# Patient Record
Sex: Male | Born: 1985 | Race: White | Hispanic: No | Marital: Single | Smoking: Former smoker
Health system: Southern US, Community
[De-identification: ages and names within clinical notes are randomized; demographics above are authoritative.]

## PROBLEM LIST (undated history)

## (undated) ENCOUNTER — Emergency Department (HOSPITAL_COMMUNITY): Discharge status: Absent without leave | Payer: Self-pay

## (undated) DIAGNOSIS — F329 Major depressive disorder, single episode, unspecified: Secondary | ICD-10-CM

## (undated) DIAGNOSIS — K859 Acute pancreatitis without necrosis or infection, unspecified: Secondary | ICD-10-CM

## (undated) DIAGNOSIS — F32A Depression, unspecified: Secondary | ICD-10-CM

## (undated) DIAGNOSIS — F1020 Alcohol dependence, uncomplicated: Secondary | ICD-10-CM

## (undated) DIAGNOSIS — I499 Cardiac arrhythmia, unspecified: Secondary | ICD-10-CM

---

## 2007-12-13 ENCOUNTER — Ambulatory Visit (HOSPITAL_COMMUNITY): Admission: RE | Admit: 2007-12-13 | Discharge: 2007-12-13 | Payer: Self-pay | Admitting: Family Medicine

## 2008-11-01 ENCOUNTER — Ambulatory Visit (HOSPITAL_COMMUNITY): Admission: RE | Admit: 2008-11-01 | Discharge: 2008-11-01 | Payer: Self-pay | Admitting: Family Medicine

## 2010-02-25 ENCOUNTER — Ambulatory Visit: Payer: Self-pay | Admitting: Psychiatry

## 2010-02-25 ENCOUNTER — Emergency Department (HOSPITAL_COMMUNITY)
Admission: EM | Admit: 2010-02-25 | Discharge: 2010-02-25 | Payer: Self-pay | Source: Home / Self Care | Admitting: Emergency Medicine

## 2010-02-25 ENCOUNTER — Inpatient Hospital Stay (HOSPITAL_COMMUNITY)
Admission: AD | Admit: 2010-02-25 | Discharge: 2010-03-01 | Payer: Self-pay | Source: Home / Self Care | Admitting: Psychiatry

## 2010-04-26 ENCOUNTER — Encounter: Payer: Self-pay | Admitting: Internal Medicine

## 2010-06-16 LAB — URINALYSIS, ROUTINE W REFLEX MICROSCOPIC
Bilirubin Urine: NEGATIVE
Glucose, UA: NEGATIVE mg/dL
Hgb urine dipstick: NEGATIVE
Ketones, ur: NEGATIVE mg/dL
Nitrite: NEGATIVE
Protein, ur: NEGATIVE mg/dL
Specific Gravity, Urine: 1.011 (ref 1.005–1.030)
Urobilinogen, UA: 0.2 mg/dL (ref 0.0–1.0)
pH: 5.5 (ref 5.0–8.0)

## 2010-06-16 LAB — ETHANOL: Alcohol, Ethyl (B): <5 mg/dL (ref 0–10)

## 2010-06-16 LAB — CBC
HCT: 42.9 % (ref 39.0–52.0)
Hemoglobin: 14.9 g/dL (ref 13.0–17.0)
MCH: 33.3 pg (ref 26.0–34.0)
MCHC: 34.7 g/dL (ref 30.0–36.0)
MCV: 96 fL (ref 78.0–100.0)
Platelets: 218 10*3/uL (ref 150–400)
RBC: 4.47 MIL/uL (ref 4.22–5.81)
RDW: 13.1 % (ref 11.5–15.5)
WBC: 7.5 10*3/uL (ref 4.0–10.5)

## 2010-06-16 LAB — DIFFERENTIAL
Basophils Absolute: 0 10*3/uL (ref 0.0–0.1)
Basophils Relative: 0 % (ref 0–1)
Eosinophils Absolute: 0.1 10*3/uL (ref 0.0–0.7)
Eosinophils Relative: 2 % (ref 0–5)
Lymphocytes Relative: 53 % — ABNORMAL HIGH (ref 12–46)
Lymphs Abs: 4 10*3/uL (ref 0.7–4.0)
Monocytes Absolute: 1 10*3/uL (ref 0.1–1.0)
Monocytes Relative: 13 % — ABNORMAL HIGH (ref 3–12)
Neutro Abs: 2.4 10*3/uL (ref 1.7–7.7)
Neutrophils Relative %: 32 % — ABNORMAL LOW (ref 43–77)

## 2010-06-16 LAB — RAPID URINE DRUG SCREEN, HOSP PERFORMED
Amphetamines: NOT DETECTED
Barbiturates: NOT DETECTED
Benzodiazepines: POSITIVE — AB
Cocaine: NOT DETECTED
Opiates: POSITIVE — AB
Tetrahydrocannabinol: NOT DETECTED

## 2010-06-16 LAB — BASIC METABOLIC PANEL
BUN: 7 mg/dL (ref 6–23)
CO2: 29 mEq/L (ref 19–32)
Calcium: 9.3 mg/dL (ref 8.4–10.5)
Chloride: 99 mEq/L (ref 96–112)
Creatinine, Ser: 0.68 mg/dL (ref 0.4–1.5)
GFR calc Af Amer: >60 mL/min (ref >60)
GFR calc non Af Amer: >60 mL/min (ref >60)
Glucose, Bld: 93 mg/dL (ref 70–99)
Potassium: 3.7 mEq/L (ref 3.5–5.1)
Sodium: 137 mEq/L (ref 135–145)

## 2014-02-18 ENCOUNTER — Encounter: Payer: Self-pay | Admitting: Internal Medicine

## 2014-03-25 ENCOUNTER — Ambulatory Visit: Payer: Self-pay | Admitting: Gastroenterology

## 2014-03-25 ENCOUNTER — Encounter: Payer: Self-pay | Admitting: Gastroenterology

## 2014-03-25 ENCOUNTER — Telehealth: Payer: Self-pay | Admitting: Gastroenterology

## 2014-03-25 NOTE — Telephone Encounter (Signed)
PATIENT WAS A NO SHOW 03/25/14 AND LETTER SENT °

## 2015-10-17 DIAGNOSIS — Z0000 Encounter for general adult medical examination without abnormal findings: Secondary | ICD-10-CM | POA: Diagnosis not present

## 2016-03-31 DIAGNOSIS — K120 Recurrent oral aphthae: Secondary | ICD-10-CM | POA: Diagnosis not present

## 2016-03-31 DIAGNOSIS — I10 Essential (primary) hypertension: Secondary | ICD-10-CM | POA: Diagnosis not present

## 2016-03-31 DIAGNOSIS — Z0000 Encounter for general adult medical examination without abnormal findings: Secondary | ICD-10-CM | POA: Diagnosis not present

## 2016-07-12 DIAGNOSIS — I10 Essential (primary) hypertension: Secondary | ICD-10-CM | POA: Diagnosis not present

## 2016-07-12 DIAGNOSIS — J40 Bronchitis, not specified as acute or chronic: Secondary | ICD-10-CM | POA: Diagnosis not present

## 2016-09-08 DIAGNOSIS — F411 Generalized anxiety disorder: Secondary | ICD-10-CM | POA: Diagnosis not present

## 2016-10-07 ENCOUNTER — Encounter (HOSPITAL_COMMUNITY): Payer: Self-pay | Admitting: Emergency Medicine

## 2016-10-07 ENCOUNTER — Inpatient Hospital Stay (HOSPITAL_COMMUNITY)
Admission: EM | Admit: 2016-10-07 | Discharge: 2016-10-10 | DRG: 440 | Disposition: A | Discharge status: Home / Self Care | Payer: BLUE CROSS/BLUE SHIELD | Attending: Family Medicine | Admitting: Family Medicine

## 2016-10-07 ENCOUNTER — Emergency Department (HOSPITAL_COMMUNITY): Payer: BLUE CROSS/BLUE SHIELD

## 2016-10-07 DIAGNOSIS — K852 Alcohol induced acute pancreatitis without necrosis or infection: Secondary | ICD-10-CM | POA: Diagnosis not present

## 2016-10-07 DIAGNOSIS — F1111 Opioid abuse, in remission: Secondary | ICD-10-CM

## 2016-10-07 DIAGNOSIS — Y906 Blood alcohol level of 120-199 mg/100 ml: Secondary | ICD-10-CM | POA: Diagnosis not present

## 2016-10-07 DIAGNOSIS — Z62810 Personal history of physical and sexual abuse in childhood: Secondary | ICD-10-CM | POA: Diagnosis present

## 2016-10-07 DIAGNOSIS — T7422XA Child sexual abuse, confirmed, initial encounter: Secondary | ICD-10-CM | POA: Insufficient documentation

## 2016-10-07 DIAGNOSIS — F1020 Alcohol dependence, uncomplicated: Secondary | ICD-10-CM | POA: Diagnosis not present

## 2016-10-07 DIAGNOSIS — R109 Unspecified abdominal pain: Secondary | ICD-10-CM | POA: Diagnosis not present

## 2016-10-07 DIAGNOSIS — K859 Acute pancreatitis without necrosis or infection, unspecified: Secondary | ICD-10-CM | POA: Diagnosis present

## 2016-10-07 DIAGNOSIS — R1013 Epigastric pain: Secondary | ICD-10-CM | POA: Diagnosis not present

## 2016-10-07 HISTORY — DX: Cardiac arrhythmia, unspecified: I49.9

## 2016-10-07 HISTORY — DX: Alcohol dependence, uncomplicated: F10.20

## 2016-10-07 HISTORY — DX: Depression, unspecified: F32.A

## 2016-10-07 HISTORY — DX: Major depressive disorder, single episode, unspecified: F32.9

## 2016-10-07 HISTORY — DX: Acute pancreatitis without necrosis or infection, unspecified: K85.90

## 2016-10-07 LAB — COMPREHENSIVE METABOLIC PANEL
ALT: 21 U/L (ref 17–63)
AST: 21 U/L (ref 15–41)
Albumin: 4.4 g/dL (ref 3.5–5.0)
Alkaline Phosphatase: 69 U/L (ref 38–126)
Anion gap: 11 (ref 5–15)
BUN: 16 mg/dL (ref 6–20)
CO2: 23 mmol/L (ref 22–32)
Calcium: 8.8 mg/dL — ABNORMAL LOW (ref 8.9–10.3)
Chloride: 107 mmol/L (ref 101–111)
Creatinine, Ser: 1.1 mg/dL (ref 0.61–1.24)
GFR calc Af Amer: >60 mL/min (ref >60)
GFR calc non Af Amer: >60 mL/min (ref >60)
Glucose, Bld: 124 mg/dL — ABNORMAL HIGH (ref 65–99)
Potassium: 3.7 mmol/L (ref 3.5–5.1)
Sodium: 141 mmol/L (ref 135–145)
Total Bilirubin: 0.6 mg/dL (ref 0.3–1.2)
Total Protein: 7.6 g/dL (ref 6.5–8.1)

## 2016-10-07 LAB — CBC
HCT: 46.2 % (ref 39.0–52.0)
Hemoglobin: 16.5 g/dL (ref 13.0–17.0)
MCH: 33.3 pg (ref 26.0–34.0)
MCHC: 35.7 g/dL (ref 30.0–36.0)
MCV: 93.1 fL (ref 78.0–100.0)
Platelets: 225 10*3/uL (ref 150–400)
RBC: 4.96 MIL/uL (ref 4.22–5.81)
RDW: 13.8 % (ref 11.5–15.5)
WBC: 13.9 10*3/uL — ABNORMAL HIGH (ref 4.0–10.5)

## 2016-10-07 LAB — LIPASE, BLOOD: Lipase: 322 U/L — ABNORMAL HIGH (ref 11–51)

## 2016-10-07 LAB — ETHANOL: Alcohol, Ethyl (B): 183 mg/dL — ABNORMAL HIGH (ref <5)

## 2016-10-07 MED ORDER — ONDANSETRON HCL 4 MG/2ML IJ SOLN
4.0000 mg | Freq: Once | INTRAMUSCULAR | Status: AC
  Administered 2016-10-07: 4 mg via INTRAVENOUS
  Filled 2016-10-07: qty 2

## 2016-10-07 MED ORDER — HYDROMORPHONE HCL 1 MG/ML IJ SOLN
1.0000 mg | Freq: Once | INTRAMUSCULAR | Status: AC
  Administered 2016-10-07: 1 mg via INTRAVENOUS
  Filled 2016-10-07: qty 1

## 2016-10-07 MED ORDER — SODIUM CHLORIDE 0.9 % IV SOLN: 1000.0000 mL | INTRAVENOUS | Status: DC

## 2016-10-07 MED ORDER — SODIUM CHLORIDE 0.9 % IV BOLUS (SEPSIS)
1000.0000 mL | Freq: Once | INTRAVENOUS | Status: AC
  Administered 2016-10-07: 1000 mL via INTRAVENOUS

## 2016-10-07 MED ORDER — MORPHINE SULFATE (PF) 4 MG/ML IV SOLN
4.0000 mg | Freq: Once | INTRAVENOUS | Status: AC
  Administered 2016-10-07: 4 mg via INTRAVENOUS
  Filled 2016-10-07: qty 1

## 2016-10-07 MED ORDER — SODIUM CHLORIDE 0.9 % IV BOLUS (SEPSIS)
2000.0000 mL | Freq: Once | INTRAVENOUS | Status: AC
  Administered 2016-10-07: 2000 mL via INTRAVENOUS

## 2016-10-07 MED ORDER — THIAMINE HCL 100 MG/ML IJ SOLN
Freq: Once | INTRAVENOUS | Status: AC
  Administered 2016-10-08: 01:00:00 via INTRAVENOUS
  Filled 2016-10-07: qty 1000

## 2016-10-07 MED ORDER — IOPAMIDOL (ISOVUE-300) INJECTION 61%
100.0000 mL | Freq: Once | INTRAVENOUS | Status: AC | PRN
  Administered 2016-10-07: 100 mL via INTRAVENOUS

## 2016-10-07 NOTE — ED Triage Notes (Signed)
Pt c/o upper abd pain that started yesterday.

## 2016-10-07 NOTE — ED Notes (Signed)
Pt informed of need for urine sample. Pt states he will let nursing staff know when able to provide sample

## 2016-10-07 NOTE — ED Provider Notes (Signed)
[facility]-EM[facility]Y DEPT Provider Note   CSN: [id number] Arrival date & time: 10/07/16  1946     History   Chief Complaint Chief Complaint  Patient presents with  . Abdominal Pain    HPI John Aguilar is a 31 y.o. male.  HPI Patient presents to the emergency room with complaints of upper abdominal pain. Patient states symptoms started today. He's had multiple episodes of vomiting and diarrhea. He has pain in his upper abdomen moving towards his chest. Patient states symptoms are severe.  They've been getting worse as the days progressed. Patient admits to drinking a gallon of vodka every other day. Past Medical History:  Diagnosis Date  . Alcoholism (HCC)   . Depression   . Irregular heart beat   . Pancreatitis     There are no active problems to display for this patient.   History reviewed. No pertinent surgical history.     Home Medications    Prior to Admission medications   Not on File    Family History History reviewed. No pertinent family history.  Social History Social History  Substance Use Topics  . Smoking status: Current Every Day Smoker  . Smokeless tobacco: Never Used  . Alcohol use Yes     Comment: pt states he dranks a gallon of liquor every 2 days.     Allergies   Patient has no allergy information on record.   Review of Systems Review of Systems  All other systems reviewed and are negative.    Physical Exam Updated Vital Signs BP 118/64   Pulse (!) 108   Temp 98 F (36.7 C)   Resp (!) 22   Ht 1.829 m (6')   Wt 122.5 kg (270 lb)   SpO2 91%   BMI 36.62 kg/m   Physical Exam  Constitutional: He appears well-developed and well-nourished. No distress.  HENT:  Head: Normocephalic and atraumatic.  Right Ear: External ear normal.  Left Ear: External ear normal.  Eyes: Conjunctivae are normal. Right eye exhibits no discharge. Left eye exhibits no discharge. No scleral icterus.  Neck: Neck supple. No tracheal deviation present.    Cardiovascular: Normal rate, regular rhythm and intact distal pulses.   Pulmonary/Chest: Effort normal and breath sounds normal. No stridor. No respiratory distress. He has no wheezes. He has no rales.  Abdominal: Soft. Bowel sounds are normal. He exhibits no distension. There is tenderness in the epigastric area. There is no rebound and no guarding.  Musculoskeletal: He exhibits no edema or tenderness.  Neurological: He is alert. He has normal strength. No cranial nerve deficit (no facial droop, extraocular movements intact, no slurred speech) or sensory deficit. He exhibits normal muscle tone. He displays no seizure activity. Coordination normal.  Skin: Skin is warm and dry. No rash noted.  Psychiatric: He has a normal mood and affect.  Nursing note and vitals reviewed.    ED Treatments / Results  Labs (all labs ordered are listed, but only abnormal results are displayed) Labs Reviewed  LIPASE, BLOOD - Abnormal; Notable for the following:       Result Value   Lipase 322 (*)    All other components within normal limits  COMPREHENSIVE METABOLIC PANEL - Abnormal; Notable for the following:    Glucose, Bld 124 (*)    Calcium 8.8 (*)    All other components within normal limits  CBC - Abnormal; Notable for the following:    WBC 13.9 (*)    All other components within  normal limits  URINALYSIS, ROUTINE W REFLEX MICROSCOPIC  ETHANOL    EKG  EKG Interpretation  Date/Time:  Thursday October 07 2016 20:28:18 EDT Ventricular Rate:  105 PR Interval:    QRS Duration: 94 QT Interval:  334 QTC Calculation: 442 R Axis:   66 Text Interpretation:  Sinus tachycardia Consider right atrial enlargement ST elev, probable normal early repol pattern No previous tracing Confirmed by Linwood Dibbles [phone]) on 10/07/2016 8:36:21 PM       Radiology Ct Abdomen Pelvis W Contrast  Result Date: 10/07/2016 CLINICAL DATA:  Upper abdominal pain starting yesterday. EXAM: CT ABDOMEN AND PELVIS WITH CONTRAST  TECHNIQUE: Multidetector CT imaging of the abdomen and pelvis was performed using the standard protocol following bolus administration of intravenous contrast. CONTRAST:  ISOVUE-300 IOPAMIDOL (ISOVUE-300) INJECTION 61% COMPARISON:  None. FINDINGS: Lower chest: No acute abnormality. Hepatobiliary: Hepatic steatosis without biliary dilatation or mass. The gallbladder is physiologically distended. Pancreas: Peripancreatic edema with edematous appearance of the pancreatic head and uncinate process. No abscess is noted. The portal and splenic veins are patent. Spleen: Normal Adrenals/Urinary Tract: Adrenal glands are unremarkable. Kidneys are normal, without renal calculi, focal lesion, or hydronephrosis. Bladder is unremarkable. Stomach/Bowel: Stomach is within normal limits. The appendix is not confidently identified. No cecal inflammation is noted. No evidence of bowel wall thickening, distention, or inflammatory changes. Vascular/Lymphatic: No significant vascular findings are present. No enlarged abdominal or pelvic lymph nodes. Reproductive: Prostate is unremarkable. Other: No abdominal wall hernia or abnormality. No abdominopelvic ascites. Musculoskeletal: No acute or significant osseous findings. IMPRESSION: 1. Acute uncomplicated pancreatitis. 2. Hepatic steatosis. Electronically Signed   By: Tollie Eth M.D.   On: 10/07/2016 21:51    Procedures Procedures (including critical care time)  Medications Ordered in ED Medications  sodium chloride 0.9 % bolus 1,000 mL (1,000 mLs Intravenous New Bag/Given 10/07/16 2051)    Followed by  0.9 %  sodium chloride infusion (not administered)  HYDROmorphone (DILAUDID) injection 1 mg (not administered)  morphine 4 MG/ML injection 4 mg (4 mg Intravenous Given 10/07/16 2052)  ondansetron (ZOFRAN) injection 4 mg (4 mg Intravenous Given 10/07/16 2052)  HYDROmorphone (DILAUDID) injection 1 mg (1 mg Intravenous Given 10/07/16 2111)  iopamidol (ISOVUE-300) 61 % injection  100 mL (100 mLs Intravenous Contrast Given 10/07/16 2131)     Initial Impression / Assessment and Plan / ED Course  I have reviewed the triage vital signs and the nursing notes.  Pertinent labs & imaging results that were available during my care of the patient were reviewed by me and considered in my medical decision making (see chart for details).   patient presents to the emergency room with severe upper abdominal pain. His laboratory tests are notable for an elevated lipase. Patient has history of chronic alcohol use. He most likely is having recurrent pancreatitis associated with his alcohol abuse.  CT abdomen pelvis ordered to rule out any acute complications such as pseudocyst.  Plan on admission for acute pancreatitis.  Final Clinical Impressions(s) / ED Diagnoses   Final diagnoses:  Alcohol-induced acute pancreatitis, unspecified complication status      Linwood Dibbles, MD 10/07/16 2203
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
## 2016-10-08 DIAGNOSIS — F1020 Alcohol dependence, uncomplicated: Secondary | ICD-10-CM

## 2016-10-08 DIAGNOSIS — K852 Alcohol induced acute pancreatitis without necrosis or infection: Principal | ICD-10-CM

## 2016-10-08 LAB — CBC
HCT: 41.8 % (ref 39.0–52.0)
Hemoglobin: 14.3 g/dL (ref 13.0–17.0)
MCH: 32.4 pg (ref 26.0–34.0)
MCHC: 34.2 g/dL (ref 30.0–36.0)
MCV: 94.8 fL (ref 78.0–100.0)
Platelets: 177 10*3/uL (ref 150–400)
RBC: 4.41 MIL/uL (ref 4.22–5.81)
RDW: 14.1 % (ref 11.5–15.5)
WBC: 10.8 10*3/uL — ABNORMAL HIGH (ref 4.0–10.5)

## 2016-10-08 LAB — URINALYSIS, ROUTINE W REFLEX MICROSCOPIC
Bacteria, UA: NONE SEEN
Bilirubin Urine: NEGATIVE
Glucose, UA: NEGATIVE mg/dL
Ketones, ur: NEGATIVE mg/dL
Leukocytes, UA: NEGATIVE
Nitrite: NEGATIVE
Protein, ur: NEGATIVE mg/dL
Specific Gravity, Urine: >1.046 — ABNORMAL HIGH (ref 1.005–1.030)
pH: 5 (ref 5.0–8.0)

## 2016-10-08 LAB — COMPREHENSIVE METABOLIC PANEL
ALT: 17 U/L (ref 17–63)
AST: 14 U/L — ABNORMAL LOW (ref 15–41)
Albumin: 3.6 g/dL (ref 3.5–5.0)
Alkaline Phosphatase: 59 U/L (ref 38–126)
Anion gap: 9 (ref 5–15)
BUN: 15 mg/dL (ref 6–20)
CO2: 23 mmol/L (ref 22–32)
Calcium: 7.8 mg/dL — ABNORMAL LOW (ref 8.9–10.3)
Chloride: 107 mmol/L (ref 101–111)
Creatinine, Ser: 0.84 mg/dL (ref 0.61–1.24)
GFR calc Af Amer: >60 mL/min (ref >60)
GFR calc non Af Amer: >60 mL/min (ref >60)
Glucose, Bld: 110 mg/dL — ABNORMAL HIGH (ref 65–99)
Potassium: 3.6 mmol/L (ref 3.5–5.1)
Sodium: 139 mmol/L (ref 135–145)
Total Bilirubin: 0.8 mg/dL (ref 0.3–1.2)
Total Protein: 6.2 g/dL — ABNORMAL LOW (ref 6.5–8.1)

## 2016-10-08 LAB — MAGNESIUM: Magnesium: 2.2 mg/dL (ref 1.7–2.4)

## 2016-10-08 LAB — LIPASE, BLOOD: Lipase: 176 U/L — ABNORMAL HIGH (ref 11–51)

## 2016-10-08 MED ORDER — LORAZEPAM 1 MG PO TABS
1.0000 mg | ORAL_TABLET | Freq: Four times a day (QID) | ORAL | Status: DC | PRN
  Administered 2016-10-10: 1 mg via ORAL
  Filled 2016-10-08: qty 1

## 2016-10-08 MED ORDER — ALBUTEROL SULFATE (2.5 MG/3ML) 0.083% IN NEBU: 2.5000 mg | INHALATION_SOLUTION | Freq: Four times a day (QID) | RESPIRATORY_TRACT | Status: DC | PRN

## 2016-10-08 MED ORDER — THIAMINE HCL 100 MG/ML IJ SOLN
INTRAMUSCULAR | Status: AC
  Filled 2016-10-08: qty 2

## 2016-10-08 MED ORDER — THIAMINE HCL 100 MG/ML IJ SOLN: 100.0000 mg | Freq: Every day | INTRAMUSCULAR | Status: DC

## 2016-10-08 MED ORDER — LORAZEPAM 1 MG PO TABS
0.0000 mg | ORAL_TABLET | Freq: Four times a day (QID) | ORAL | Status: AC
  Administered 2016-10-09 (×2): 1 mg via ORAL
  Filled 2016-10-08 (×2): qty 1

## 2016-10-08 MED ORDER — LORAZEPAM 1 MG PO TABS
0.0000 mg | ORAL_TABLET | Freq: Two times a day (BID) | ORAL | Status: DC
  Administered 2016-10-09: 1 mg via ORAL
  Filled 2016-10-08: qty 1

## 2016-10-08 MED ORDER — KETOROLAC TROMETHAMINE 30 MG/ML IJ SOLN
30.0000 mg | Freq: Four times a day (QID) | INTRAMUSCULAR | Status: DC | PRN
  Administered 2016-10-08 (×2): 30 mg via INTRAVENOUS
  Filled 2016-10-08 (×2): qty 1

## 2016-10-08 MED ORDER — ONDANSETRON HCL 4 MG PO TABS: 4.0000 mg | ORAL_TABLET | Freq: Four times a day (QID) | ORAL | Status: DC | PRN

## 2016-10-08 MED ORDER — M.V.I. ADULT IV INJ
INJECTION | INTRAVENOUS | Status: AC
  Filled 2016-10-08: qty 10

## 2016-10-08 MED ORDER — FOLIC ACID 1 MG PO TABS
1.0000 mg | ORAL_TABLET | Freq: Every day | ORAL | Status: DC
  Administered 2016-10-09 – 2016-10-10 (×2): 1 mg via ORAL
  Filled 2016-10-08 (×3): qty 1

## 2016-10-08 MED ORDER — MORPHINE SULFATE (PF) 2 MG/ML IV SOLN
2.0000 mg | INTRAVENOUS | Status: DC | PRN
  Administered 2016-10-08 – 2016-10-10 (×18): 2 mg via INTRAVENOUS
  Filled 2016-10-08 (×18): qty 1

## 2016-10-08 MED ORDER — ADULT MULTIVITAMIN W/MINERALS CH
1.0000 | ORAL_TABLET | Freq: Every day | ORAL | Status: DC
  Administered 2016-10-09 – 2016-10-10 (×2): 1 via ORAL
  Filled 2016-10-08 (×3): qty 1

## 2016-10-08 MED ORDER — LORAZEPAM 2 MG/ML IJ SOLN
1.0000 mg | Freq: Four times a day (QID) | INTRAMUSCULAR | Status: DC | PRN
  Administered 2016-10-08 (×2): 1 mg via INTRAVENOUS
  Filled 2016-10-08 (×2): qty 1

## 2016-10-08 MED ORDER — ENOXAPARIN SODIUM 60 MG/0.6ML ~~LOC~~ SOLN
0.5000 mg/kg | SUBCUTANEOUS | Status: DC
  Administered 2016-10-08 – 2016-10-09 (×3): 60 mg via SUBCUTANEOUS
  Filled 2016-10-08 (×3): qty 0.6

## 2016-10-08 MED ORDER — SODIUM CHLORIDE 0.9 % IV SOLN
INTRAVENOUS | Status: DC
  Administered 2016-10-08 – 2016-10-10 (×8): via INTRAVENOUS

## 2016-10-08 MED ORDER — FOLIC ACID 5 MG/ML IJ SOLN
INTRAMUSCULAR | Status: AC
  Filled 2016-10-08: qty 0.2

## 2016-10-08 MED ORDER — VITAMIN B-1 100 MG PO TABS
100.0000 mg | ORAL_TABLET | Freq: Every day | ORAL | Status: DC
  Administered 2016-10-09 – 2016-10-10 (×2): 100 mg via ORAL
  Filled 2016-10-08 (×3): qty 1

## 2016-10-08 MED ORDER — ONDANSETRON HCL 4 MG/2ML IJ SOLN
4.0000 mg | Freq: Four times a day (QID) | INTRAMUSCULAR | Status: DC | PRN
  Administered 2016-10-08 (×2): 4 mg via INTRAVENOUS
  Filled 2016-10-08 (×2): qty 2

## 2016-10-08 NOTE — Clinical Social Work Note (Signed)
LCSW met with patient. Patient advised that he has made arrangements to go to [facility]. Patient stated that she was in fellowship last September. Patient stated that he has an admission to [facility] scheduled for Saturday or Sunday depending on which day he is released from the hospital.   He did not have additional CSW needs. LCSW signing off.          John Aguilar, Clydene Pugh, LCSW

## 2016-10-08 NOTE — Progress Notes (Signed)
Pt appears to be resting in bed, eyes closed and snoring at this time.  Will continue to monitor pt.

## 2016-10-08 NOTE — Care Management Note (Signed)
Case Management Note  Patient Details  Name: John Aguilar MRN: [id number] Date of Birth: 10/30/1985  Subjective/Objective:   Adm with acute pancreatitis. Chart Reviewed. No CM consult. Patient ind PTA. Interested in treatment for ETOH abuse once medically stable per notes. CSW consult is in place.               Action/Plan: CSW aware of consult. No CM needs anticipated.    Expected Discharge Date:    10/10/2016           Expected Discharge Plan:     In-House Referral:  Clinical Social Work  Discharge planning Services  CM Consult  Post Acute Care Choice:    Choice offered to:     DME Arranged:    DME Agency:     HH Arranged:    HH Agency:     Status of Service:  In process, will continue to follow  If discussed at Long Length of Stay Meetings, dates discussed:    Additional Comments:  Honour Schwieger, Chrystine OilerSharley Diane, RN 10/08/2016, 12:08 PM
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
## 2016-10-09 LAB — BASIC METABOLIC PANEL
Anion gap: 7 (ref 5–15)
BUN: 11 mg/dL (ref 6–20)
CO2: 26 mmol/L (ref 22–32)
Calcium: 8.3 mg/dL — ABNORMAL LOW (ref 8.9–10.3)
Chloride: 106 mmol/L (ref 101–111)
Creatinine, Ser: 0.84 mg/dL (ref 0.61–1.24)
GFR calc Af Amer: >60 mL/min (ref >60)
GFR calc non Af Amer: >60 mL/min (ref >60)
Glucose, Bld: 95 mg/dL (ref 65–99)
Potassium: 3.6 mmol/L (ref 3.5–5.1)
Sodium: 139 mmol/L (ref 135–145)

## 2016-10-09 LAB — LIPASE, BLOOD: Lipase: 103 U/L — ABNORMAL HIGH (ref 11–51)
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
## 2016-10-10 LAB — BASIC METABOLIC PANEL
Anion gap: 7 (ref 5–15)
BUN: 7 mg/dL (ref 6–20)
CO2: 30 mmol/L (ref 22–32)
Calcium: 8.6 mg/dL — ABNORMAL LOW (ref 8.9–10.3)
Chloride: 104 mmol/L (ref 101–111)
Creatinine, Ser: 0.8 mg/dL (ref 0.61–1.24)
GFR calc Af Amer: >60 mL/min (ref >60)
GFR calc non Af Amer: >60 mL/min (ref >60)
Glucose, Bld: 96 mg/dL (ref 65–99)
Potassium: 3.8 mmol/L (ref 3.5–5.1)
Sodium: 141 mmol/L (ref 135–145)

## 2016-10-10 LAB — LIPASE, BLOOD: Lipase: 53 U/L — ABNORMAL HIGH (ref 11–51)

## 2016-10-10 MED ORDER — ACETAMINOPHEN 325 MG PO TABS: 650.0000 mg | ORAL_TABLET | Freq: Four times a day (QID) | ORAL | Status: DC | PRN

## 2016-10-10 MED ORDER — ACETAMINOPHEN 325 MG PO TABS: 650.0000 mg | ORAL_TABLET | Freq: Four times a day (QID) | ORAL | Status: AC | PRN

## 2016-10-10 MED ORDER — TRAMADOL HCL 50 MG PO TABS: 50.0000 mg | ORAL_TABLET | Freq: Four times a day (QID) | ORAL | Status: DC | PRN
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
## 2016-10-10 NOTE — Progress Notes (Signed)
Discharge summary faxed to [facility]/admissions per care order/instruction.

## 2016-10-10 NOTE — Discharge Summary (Signed)
Physician Discharge Summary  John Aguilar:[id number] DOB: [birth year] DOA: 10/07/2016  PCP: Benita Stabile, MD  Admit date: 10/07/2016 Discharge date: 10/10/2016  Recommendations for Outpatient Follow-up:  1. Resolution of pancreatitis 2. Follow-up post alcohol rehabilitation. Continue to encourage abstinence.  Follow-up Information    Benita Stabile, MD Follow up.   Specialty:  Internal Medicine Why:  as needed Contact information: [address] [place] [address] [phone]            Discharge Diagnoses:  1. Acute alcoholic pancreatitis 2. Alcoholism  Discharge Condition: Improved Disposition: Home  Diet recommendation: Start with clear liquids, gradually advance to very low fat diet  Filed Weights   10/07/16 1950 10/08/16 0000  Weight: 122.5 kg (270 lb) 123.8 kg (272 lb 14.9 oz)    History of present illness:  31 year old man PMH alcoholism, alcohol-induced pancreatitis, fentanyl abuse sober for years, presented with epigastric pain and vomiting. Admitted for acute pancreatitis.  Hospital Course:  Treated with supportive care with gradual resolution of abdominal pain. Lipase trended down. No evidence of complicating features. No evidence of alcohol withdrawal. Diet advanced to clear liquids which were tolerated. Discussed slow advancement of diet as an outpatient, starting with clear liquids and advancing gradually to low fat diet. Patient has independently arranged for inpatient alcohol rehabilitation starting 7/10.  Today's assessment: See progress note dated same day  Discharge Instructions  Discharge Instructions    Activity as tolerated - No restrictions    Complete by:  As directed    Diet general    Complete by:  As directed    Discharge instructions    Complete by:  As directed    Call your physician or seek immediate medical attention for abdominal pain, vomiting, fever, or worsening of condition. Clear liquids for dinner tonight and  breakfast 7/9. If no pain, then may advance to very low fat diet.     Allergies as of 10/10/2016   No Known Allergies     Medication List    TAKE these medications   acetaminophen 325 MG tablet Commonly known as:  TYLENOL Take 2 tablets (650 mg total) by mouth every 6 (six) hours as needed for mild pain.   ibuprofen 200 MG tablet Commonly known as:  ADVIL,MOTRIN Take 200 mg by mouth 3 (three) times daily as needed for moderate pain.   omeprazole 20 MG capsule Commonly known as:  PRILOSEC Take 20 mg by mouth at bedtime.   sertraline 100 MG tablet Commonly known as:  ZOLOFT Take 150 mg by mouth at bedtime.      No Known Allergies  The results of significant diagnostics from this hospitalization (including imaging, microbiology, ancillary and laboratory) are listed below for reference.    Significant Diagnostic Studies: Ct Abdomen Pelvis W Contrast  Result Date: 10/07/2016 CLINICAL DATA:  Upper abdominal pain starting yesterday. EXAM: CT ABDOMEN AND PELVIS WITH CONTRAST TECHNIQUE: Multidetector CT imaging of the abdomen and pelvis was performed using the standard protocol following bolus administration of intravenous contrast. CONTRAST:  ISOVUE-300 IOPAMIDOL (ISOVUE-300) INJECTION 61% COMPARISON:  None. FINDINGS: Lower chest: No acute abnormality. Hepatobiliary: Hepatic steatosis without biliary dilatation or mass. The gallbladder is physiologically distended. Pancreas: Peripancreatic edema with edematous appearance of the pancreatic head and uncinate process. No abscess is noted. The portal and splenic veins are patent. Spleen: Normal Adrenals/Urinary Tract: Adrenal glands are unremarkable. Kidneys are normal, without renal calculi, focal lesion, or hydronephrosis. Bladder is unremarkable. Stomach/Bowel: Stomach is within  normal limits. The appendix is not confidently identified. No cecal inflammation is noted. No evidence of bowel wall thickening, distention, or inflammatory  changes. Vascular/Lymphatic: No significant vascular findings are present. No enlarged abdominal or pelvic lymph nodes. Reproductive: Prostate is unremarkable. Other: No abdominal wall hernia or abnormality. No abdominopelvic ascites. Musculoskeletal: No acute or significant osseous findings. IMPRESSION: 1. Acute uncomplicated pancreatitis. 2. Hepatic steatosis. Electronically Signed   By: Tollie Ethavid  Kwon M.D.   On: 10/07/2016 21:51   Labs: Basic Metabolic Panel:  Recent Labs Lab 10/07/16 1955 10/08/16 0604 10/09/16 0544 10/10/16 0745  NA 141 139 139 141  K 3.7 3.6 3.6 3.8  CL 107 107 106 104  CO2 23 23 26 30   GLUCOSE 124* 110* 95 96  BUN 16 15 11 7   CREATININE 1.10 0.84 0.84 0.80  CALCIUM 8.8* 7.8* 8.3* 8.6*  MG 2.2  --   --   --    Liver Function Tests:  Recent Labs Lab 10/07/16 1955 10/08/16 0604  AST 21 14*  ALT 21 17  ALKPHOS 69 59  BILITOT 0.6 0.8  PROT 7.6 6.2*  ALBUMIN 4.4 3.6    Recent Labs Lab 10/07/16 1955 10/08/16 0604 10/09/16 0544 10/10/16 0745  LIPASE 322* 176* 103* 53*   CBC:  Recent Labs Lab 10/07/16 1955 10/08/16 0604  WBC 13.9* 10.8*  HGB 16.5 14.3  HCT 46.2 41.8  MCV 93.1 94.8  PLT 225 177    Principal Problem:   Pancreatitis, acute Active Problems:   Narcotic abuse in remission   Alcoholism (HCC)   Time coordinating discharge: 35 minutes  Signed:  Brendia Sacksaniel Goodrich, MD [facility] 10/10/2016, 3:58 PM

## 2016-10-10 NOTE — Progress Notes (Signed)
Pt IVs removed per NT, tolerated well.  Reviewed discharge instructions with pt and answered all questions at this time.

## 2017-04-22 DIAGNOSIS — J019 Acute sinusitis, unspecified: Secondary | ICD-10-CM | POA: Diagnosis not present

## 2017-05-17 DIAGNOSIS — R7301 Impaired fasting glucose: Secondary | ICD-10-CM | POA: Diagnosis not present

## 2017-05-17 DIAGNOSIS — E782 Mixed hyperlipidemia: Secondary | ICD-10-CM | POA: Diagnosis not present

## 2017-05-17 DIAGNOSIS — I10 Essential (primary) hypertension: Secondary | ICD-10-CM | POA: Diagnosis not present

## 2017-05-20 DIAGNOSIS — Z0001 Encounter for general adult medical examination with abnormal findings: Secondary | ICD-10-CM | POA: Diagnosis not present

## 2017-11-23 DIAGNOSIS — E785 Hyperlipidemia, unspecified: Secondary | ICD-10-CM | POA: Diagnosis not present

## 2017-11-23 DIAGNOSIS — R7301 Impaired fasting glucose: Secondary | ICD-10-CM | POA: Diagnosis not present

## 2017-11-23 DIAGNOSIS — I10 Essential (primary) hypertension: Secondary | ICD-10-CM | POA: Diagnosis not present

## 2017-11-23 DIAGNOSIS — E782 Mixed hyperlipidemia: Secondary | ICD-10-CM | POA: Diagnosis not present

## 2017-11-25 DIAGNOSIS — R7301 Impaired fasting glucose: Secondary | ICD-10-CM | POA: Diagnosis not present

## 2017-11-25 DIAGNOSIS — F339 Major depressive disorder, recurrent, unspecified: Secondary | ICD-10-CM | POA: Diagnosis not present

## 2017-11-25 DIAGNOSIS — E782 Mixed hyperlipidemia: Secondary | ICD-10-CM | POA: Diagnosis not present

## 2017-11-25 DIAGNOSIS — K219 Gastro-esophageal reflux disease without esophagitis: Secondary | ICD-10-CM | POA: Diagnosis not present

## 2018-02-13 IMAGING — CT CT ABD-PELV W/ CM
2 of 4 series · 17 of 46 positions shown, 19 images · IV contrast (Isovue)
Comparison: None.

CLINICAL DATA: Upper abdominal pain starting yesterday.

EXAM:
CT ABDOMEN AND PELVIS WITH CONTRAST
TECHNIQUE: Multidetector CT imaging of the abdomen and pelvis was performed
using the standard protocol following bolus administration of
intravenous contrast.
CONTRAST:  100mL DNTCBX-XOO IOPAMIDOL (DNTCBX-XOO) INJECTION 61%

[Series 2: axial st · axial · 0.94mm/px · z∈[-429,+26]mm · 14 of 101 slices shown, 16 images]
[im 5/101  soft-tissue]
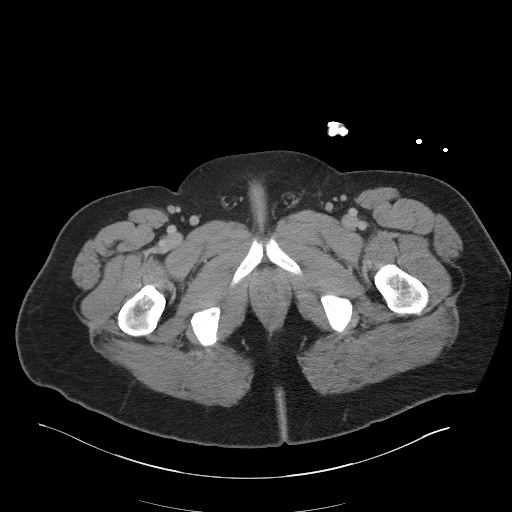
[im 5/101  bone]
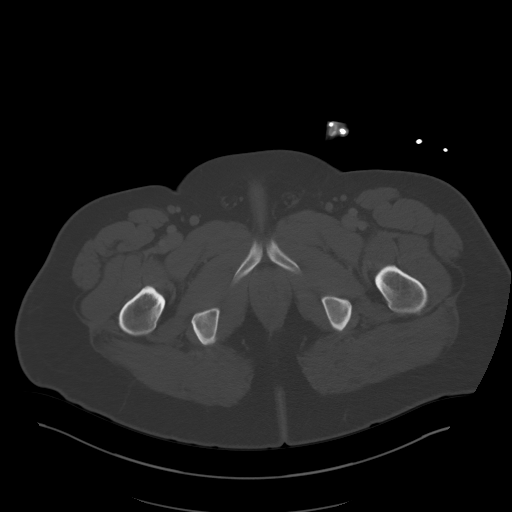
[im 15/101  soft-tissue]
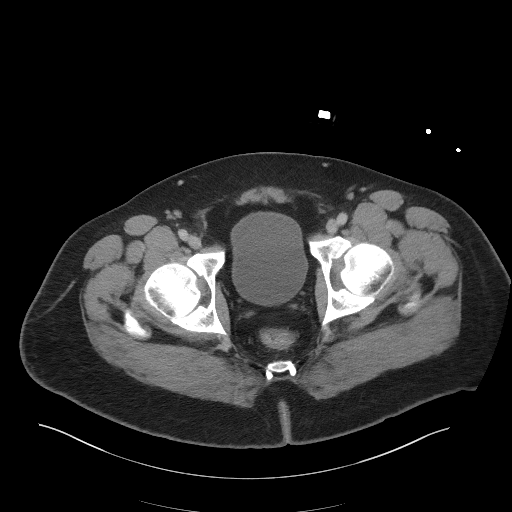
[im 20/101  soft-tissue]
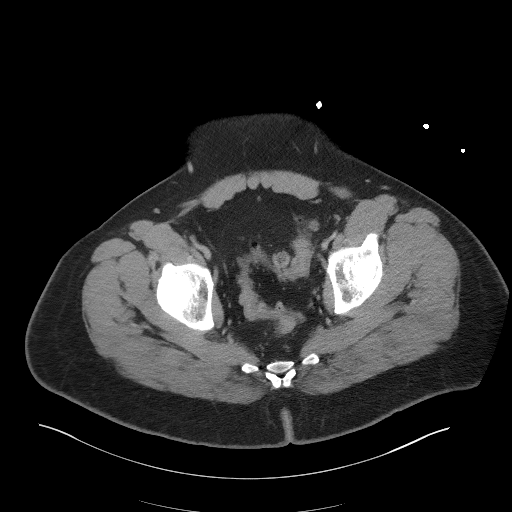
[im 29/101  soft-tissue]
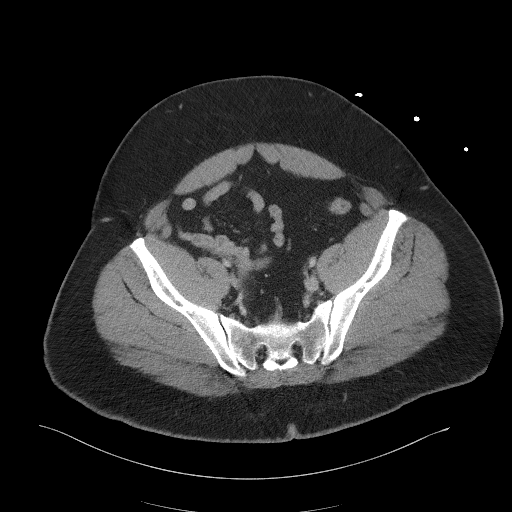
[im 34/101  soft-tissue]
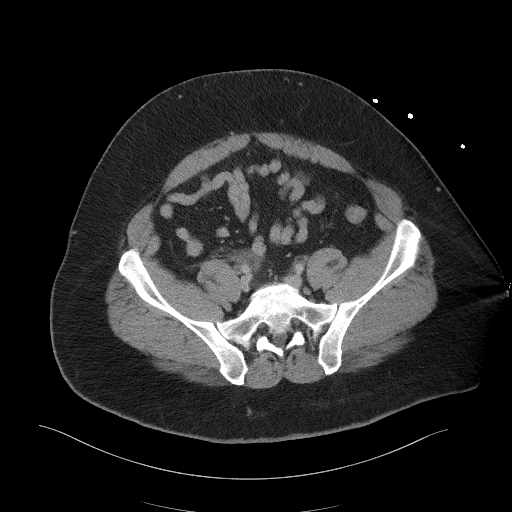
[im 39/101  soft-tissue]
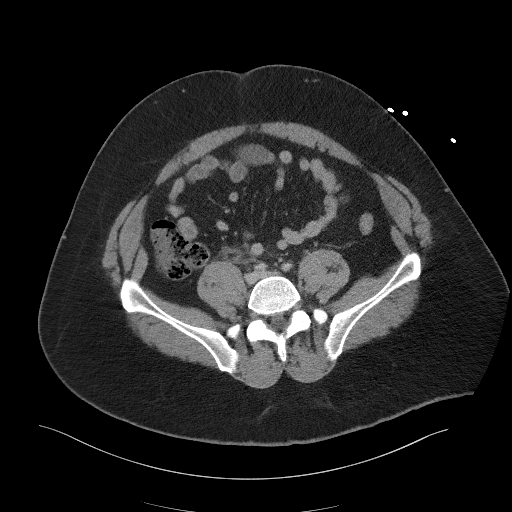
[im 48/101  soft-tissue]
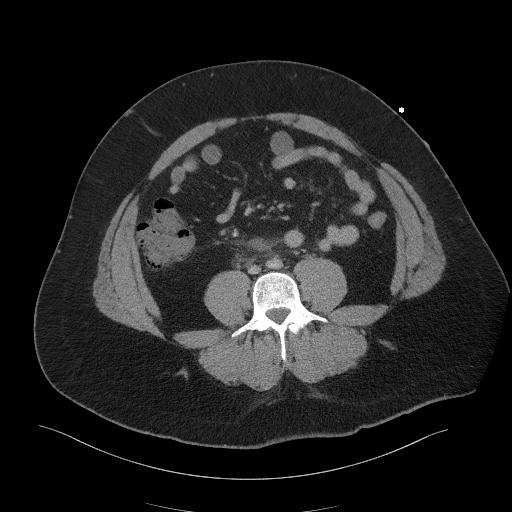
[im 53/101  soft-tissue]
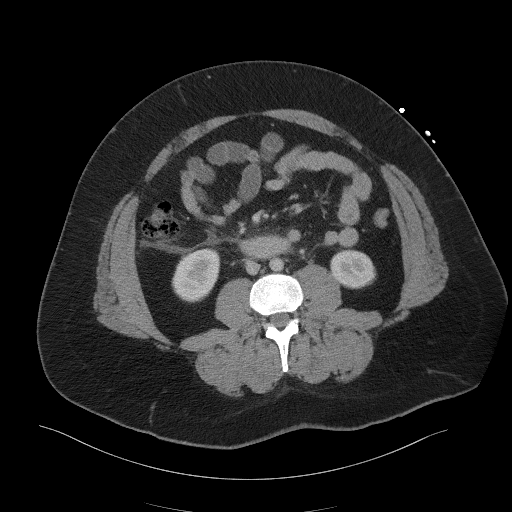
[im 62/101  soft-tissue]
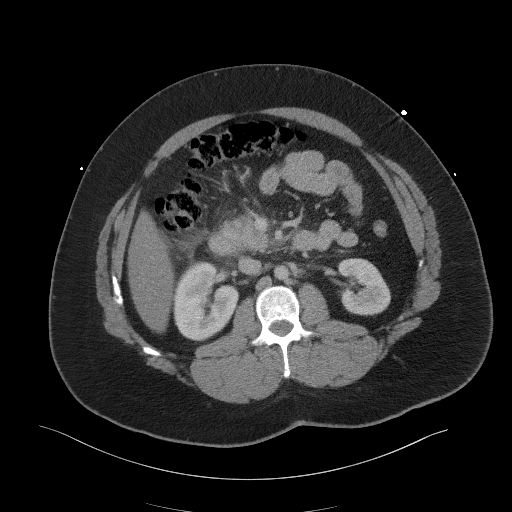
[im 62/101  bone]
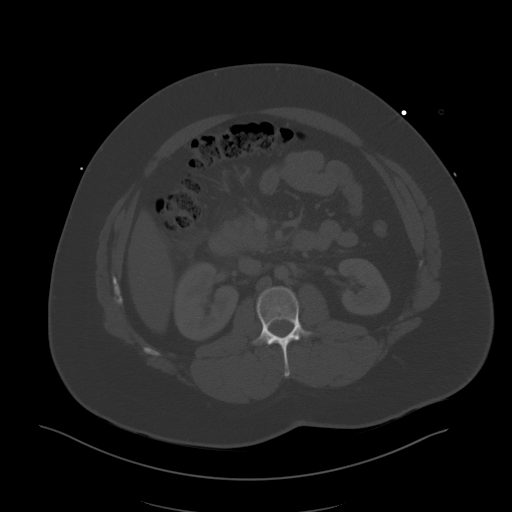
[im 67/101  soft-tissue]
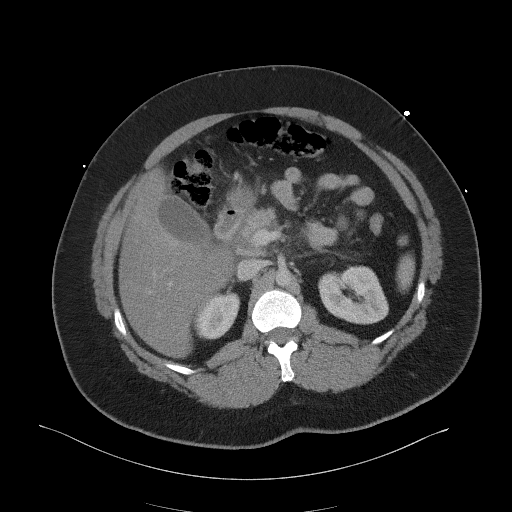
[im 77/101  soft-tissue]
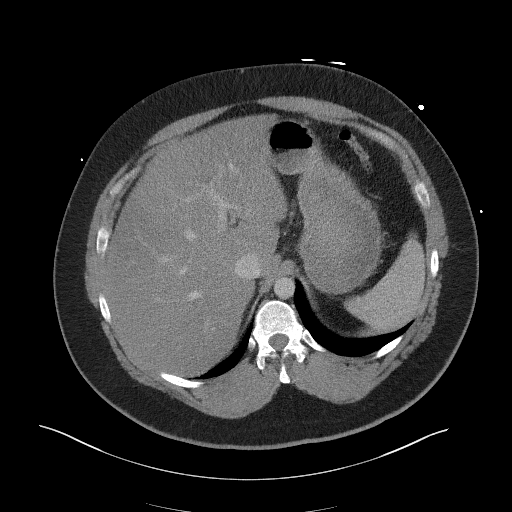
[im 81/101  soft-tissue]
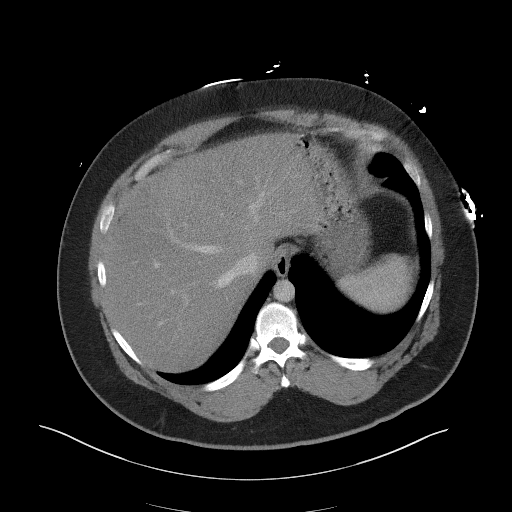
[im 86/101  soft-tissue]
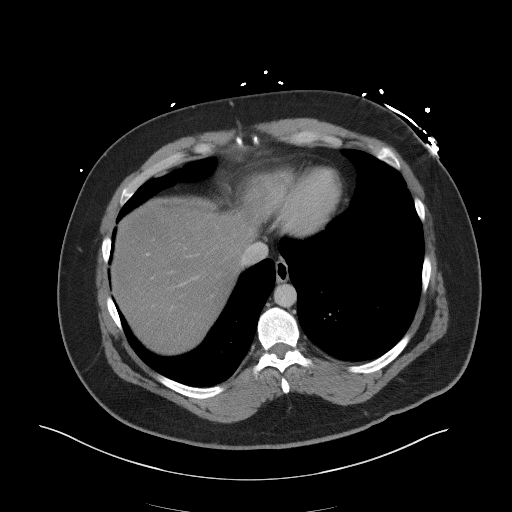
[im 96/101  soft-tissue]
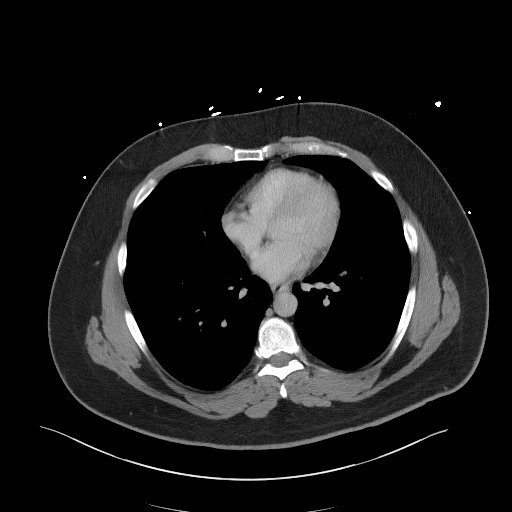

[Series 5: coronal st · coronal · 0.87mm/px · 3 of 117 slices shown]
[im 39/117  soft-tissue]
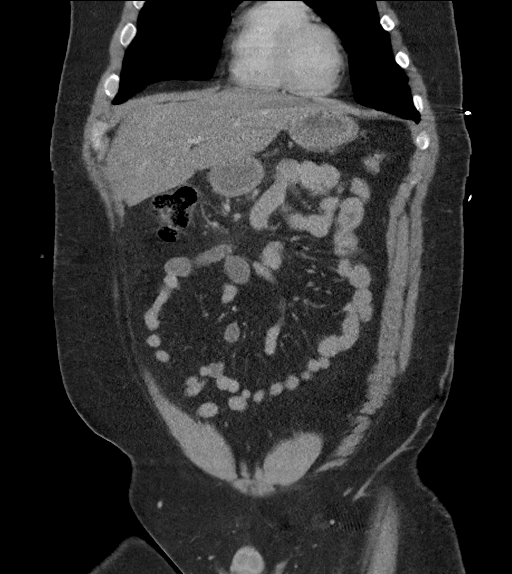
[im 52/117  soft-tissue]
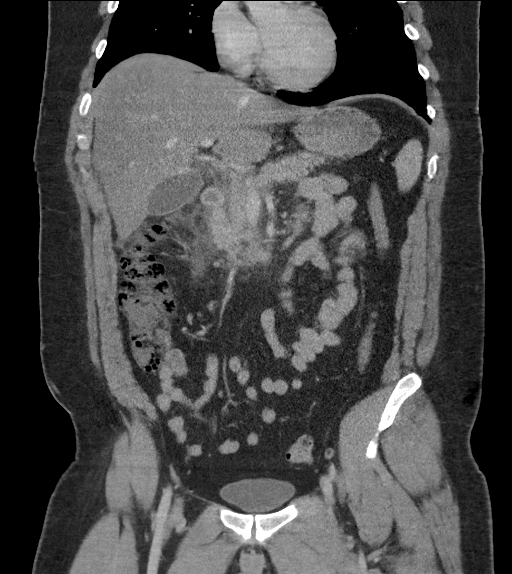
[im 65/117  soft-tissue]
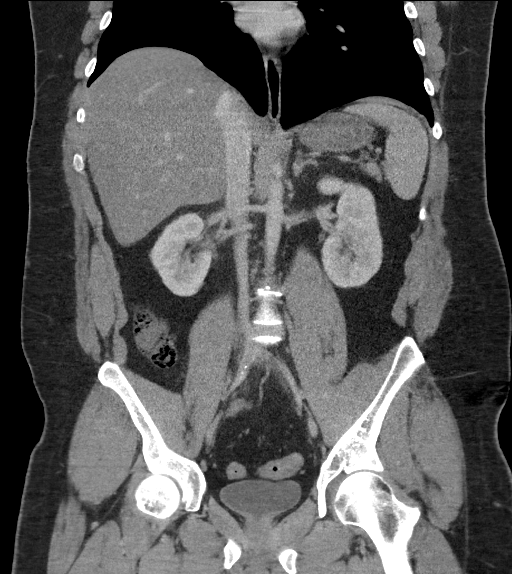

[17 of 46 positions shown; findings below may reference images not displayed]

FINDINGS: Lower chest: No acute abnormality.

Hepatobiliary: Hepatic steatosis without biliary dilatation or mass.
The gallbladder is physiologically distended.

Pancreas: Peripancreatic edema with edematous appearance of the
pancreatic head and uncinate process. No abscess is noted. The
portal and splenic veins are patent.

Spleen: Normal

Adrenals/Urinary Tract: Adrenal glands are unremarkable. Kidneys are
normal, without renal calculi, focal lesion, or hydronephrosis.
Bladder is unremarkable.

Stomach/Bowel: Stomach is within normal limits. The appendix is not
confidently identified. No cecal inflammation is noted. No evidence
of bowel wall thickening, distention, or inflammatory changes.

Vascular/Lymphatic: No significant vascular findings are present. No
enlarged abdominal or pelvic lymph nodes.

Reproductive: Prostate is unremarkable.

Other: No abdominal wall hernia or abnormality. No abdominopelvic
ascites.

Musculoskeletal: No acute or significant osseous findings.
IMPRESSION: 1. Acute uncomplicated pancreatitis.
2. Hepatic steatosis.

## 2018-07-20 DIAGNOSIS — R7301 Impaired fasting glucose: Secondary | ICD-10-CM | POA: Diagnosis not present

## 2018-07-20 DIAGNOSIS — K219 Gastro-esophageal reflux disease without esophagitis: Secondary | ICD-10-CM | POA: Diagnosis not present

## 2018-07-20 DIAGNOSIS — F331 Major depressive disorder, recurrent, moderate: Secondary | ICD-10-CM | POA: Diagnosis not present

## 2018-07-20 DIAGNOSIS — E782 Mixed hyperlipidemia: Secondary | ICD-10-CM | POA: Diagnosis not present

## 2018-10-24 DIAGNOSIS — I10 Essential (primary) hypertension: Secondary | ICD-10-CM | POA: Diagnosis not present

## 2018-10-24 DIAGNOSIS — R7301 Impaired fasting glucose: Secondary | ICD-10-CM | POA: Diagnosis not present

## 2018-10-24 DIAGNOSIS — E782 Mixed hyperlipidemia: Secondary | ICD-10-CM | POA: Diagnosis not present

## 2018-10-24 DIAGNOSIS — E785 Hyperlipidemia, unspecified: Secondary | ICD-10-CM | POA: Diagnosis not present

## 2018-10-31 DIAGNOSIS — I10 Essential (primary) hypertension: Secondary | ICD-10-CM | POA: Diagnosis not present

## 2018-10-31 DIAGNOSIS — Z1331 Encounter for screening for depression: Secondary | ICD-10-CM | POA: Diagnosis not present

## 2018-10-31 DIAGNOSIS — Z0001 Encounter for general adult medical examination with abnormal findings: Secondary | ICD-10-CM | POA: Diagnosis not present

## 2018-10-31 DIAGNOSIS — E785 Hyperlipidemia, unspecified: Secondary | ICD-10-CM | POA: Diagnosis not present

## 2019-02-26 DIAGNOSIS — E782 Mixed hyperlipidemia: Secondary | ICD-10-CM | POA: Diagnosis not present

## 2019-02-26 DIAGNOSIS — N62 Hypertrophy of breast: Secondary | ICD-10-CM | POA: Diagnosis not present

## 2019-02-26 DIAGNOSIS — K219 Gastro-esophageal reflux disease without esophagitis: Secondary | ICD-10-CM | POA: Diagnosis not present

## 2019-02-26 DIAGNOSIS — R7301 Impaired fasting glucose: Secondary | ICD-10-CM | POA: Diagnosis not present

## 2019-02-26 DIAGNOSIS — J069 Acute upper respiratory infection, unspecified: Secondary | ICD-10-CM | POA: Diagnosis not present

## 2019-02-26 DIAGNOSIS — Z20828 Contact with and (suspected) exposure to other viral communicable diseases: Secondary | ICD-10-CM | POA: Diagnosis not present

## 2019-03-26 ENCOUNTER — Other Ambulatory Visit: Payer: BLUE CROSS/BLUE SHIELD

## 2019-03-26 DIAGNOSIS — Z20828 Contact with and (suspected) exposure to other viral communicable diseases: Secondary | ICD-10-CM | POA: Diagnosis not present

## 2019-03-26 DIAGNOSIS — R519 Headache, unspecified: Secondary | ICD-10-CM | POA: Diagnosis not present

## 2019-04-02 DIAGNOSIS — Z20828 Contact with and (suspected) exposure to other viral communicable diseases: Secondary | ICD-10-CM | POA: Diagnosis not present

## 2019-04-14 DIAGNOSIS — Z23 Encounter for immunization: Secondary | ICD-10-CM | POA: Diagnosis not present

## 2019-04-24 DIAGNOSIS — E782 Mixed hyperlipidemia: Secondary | ICD-10-CM | POA: Diagnosis not present

## 2019-04-24 DIAGNOSIS — I10 Essential (primary) hypertension: Secondary | ICD-10-CM | POA: Diagnosis not present

## 2019-04-24 DIAGNOSIS — R7301 Impaired fasting glucose: Secondary | ICD-10-CM | POA: Diagnosis not present

## 2019-04-24 DIAGNOSIS — E785 Hyperlipidemia, unspecified: Secondary | ICD-10-CM | POA: Diagnosis not present

## 2019-05-12 DIAGNOSIS — Z23 Encounter for immunization: Secondary | ICD-10-CM | POA: Diagnosis not present

## 2019-10-18 DIAGNOSIS — J069 Acute upper respiratory infection, unspecified: Secondary | ICD-10-CM | POA: Diagnosis not present

## 2019-11-02 DIAGNOSIS — J069 Acute upper respiratory infection, unspecified: Secondary | ICD-10-CM | POA: Diagnosis not present

## 2020-09-06 ENCOUNTER — Other Ambulatory Visit: Payer: Self-pay

## 2020-09-06 ENCOUNTER — Ambulatory Visit
Admission: RE | Admit: 2020-09-06 | Discharge: 2020-09-06 | Disposition: A | Discharge status: Home / Self Care | Payer: 59 | Source: Ambulatory Visit | Attending: Emergency Medicine | Admitting: Emergency Medicine

## 2020-09-06 VITALS — BP 131/85 | HR 78 | Temp 98.3°F | Resp 20

## 2020-09-06 DIAGNOSIS — L02411 Cutaneous abscess of right axilla: Secondary | ICD-10-CM

## 2020-09-06 MED ORDER — CLINDAMYCIN PHOSPHATE 1 % EX GEL: Freq: Two times a day (BID) | CUTANEOUS | 0 refills | Status: AC

## 2020-09-06 MED ORDER — DOXYCYCLINE HYCLATE 100 MG PO CAPS: 100.0000 mg | ORAL_CAPSULE | Freq: Two times a day (BID) | ORAL | 0 refills | Status: AC

## 2020-09-06 NOTE — ED Triage Notes (Signed)
Pt presents with abscess under right axilla for past week

## 2020-09-06 NOTE — Discharge Instructions (Signed)
Apply warm compresses 3-4x daily for 10-15 minutes Wash site daily with warm water and mild soap Keep covered to avoid friction Take antibiotic as prescribed and to completion Follow up here or with PCP if symptoms persists Return or go to the ED if you have any new or worsening symptoms increased redness, swelling, pain, nausea, vomiting, fever, chills, etc...  

## 2020-09-06 NOTE — ED Provider Notes (Signed)
[facility]   [id number] 09/06/20 Arrival Time: 1249   DX:AJOINOM  SUBJECTIVE:  John Aguilar is a 35 y.o. male who presented to the urgent care with a complaint of abscess under right axilla for the past week.  Denies any precipitating event.  Has tried OTC medication without relief.  Denies any alleviating or aggravating factors.  Reports similar symptoms in the past and was treated with clindamycin.  He denies chills, fever, nausea, vomiting, diarrhea.  ROS: As per HPI.  All other pertinent ROS negative.     Past Medical History:  Diagnosis Date  . Alcoholism (HCC)   . Depression   . Irregular heart beat   . Pancreatitis    No past surgical history on file. No Known Allergies No current facility-administered medications on file prior to encounter.   Current Outpatient Medications on File Prior to Encounter  Medication Sig Dispense Refill  . acetaminophen (TYLENOL) 325 MG tablet Take 2 tablets (650 mg total) by mouth every 6 (six) hours as needed for mild pain.    Marland Kitchen ibuprofen (ADVIL,MOTRIN) 200 MG tablet Take 200 mg by mouth 3 (three) times daily as needed for moderate pain.     Marland Kitchen omeprazole (PRILOSEC) 20 MG capsule Take 20 mg by mouth at bedtime.     . sertraline (ZOLOFT) 100 MG tablet Take 150 mg by mouth at bedtime.      Social History   Socioeconomic History  . Marital status: Single    Spouse name: Not on file  . Number of children: Not on file  . Years of education: Not on file  . Highest education level: Not on file  Occupational History  . Not on file  Tobacco Use  . Smoking status: Current Every Day Smoker  . Smokeless tobacco: Never Used  Substance and Sexual Activity  . Alcohol use: Yes    Comment: pt states he dranks a gallon of liquor every 2 days.  . Drug use: No  . Sexual activity: Not on file  Other Topics Concern  . Not on file  Social History Narrative  . Not on file   Social Determinants of Health   Financial Resource Strain:  Not on file  Food Insecurity: Not on file  Transportation Needs: Not on file  Physical Activity: Not on file  Stress: Not on file  Social Connections: Not on file  Intimate Partner Violence: Not on file   No family history on file.  OBJECTIVE:  Vitals:   09/06/20 1308  BP: 131/85  Pulse: 78  Resp: 20  Temp: 98.3 F (36.8 C)  SpO2: 95%     General appearance: alert; no distress Heart: RRR, no rub, gallop or murmur Chest: CTA, heart sounds normal Skin: 4 cm induration of his right axilla; tender to touch; no active drainage at this time Psychological: alert and cooperative; normal mood and affect    ASSESSMENT & PLAN:  1. Cutaneous abscess of right axilla     Meds ordered this encounter  Medications  . doxycycline (VIBRAMYCIN) 100 MG capsule    Sig: Take 1 capsule (100 mg total) by mouth 2 (two) times daily.    Dispense:  20 capsule    Refill:  0  . clindamycin (CLINDAGEL) 1 % gel    Sig: Apply topically 2 (two) times daily.    Dispense:  30 g    Refill:  0    Discharge instructions  Apply warm compresses 3-4x daily for 10-15 minutes Wash site  daily with warm water and mild soap Keep covered to avoid friction Take antibiotic as prescribed and to completion Follow up here or with PCP if symptoms persists Return or go to the ED if you have any new or worsening symptoms increased redness, swelling, pain, nausea, vomiting, fever, chills, etc...   Reviewed expectations re: course of current medical issues. Questions answered. Outlined signs and symptoms indicating need for more acute intervention. Patient verbalized understanding. After Visit Summary given.          Durward Parcel, FNP 09/06/20 1341

## 2021-03-25 DIAGNOSIS — Z6835 Body mass index (BMI) 35.0-35.9, adult: Secondary | ICD-10-CM | POA: Diagnosis not present

## 2021-03-25 DIAGNOSIS — H6501 Acute serous otitis media, right ear: Secondary | ICD-10-CM | POA: Diagnosis not present

## 2021-03-25 DIAGNOSIS — U071 COVID-19: Secondary | ICD-10-CM | POA: Diagnosis not present

## 2021-03-25 DIAGNOSIS — J329 Chronic sinusitis, unspecified: Secondary | ICD-10-CM | POA: Diagnosis not present

## 2021-09-17 DIAGNOSIS — Z3141 Encounter for fertility testing: Secondary | ICD-10-CM | POA: Diagnosis not present

## 2021-10-16 DIAGNOSIS — R030 Elevated blood-pressure reading, without diagnosis of hypertension: Secondary | ICD-10-CM | POA: Diagnosis not present

## 2021-10-16 DIAGNOSIS — J988 Other specified respiratory disorders: Secondary | ICD-10-CM | POA: Diagnosis not present

## 2021-10-16 DIAGNOSIS — B9789 Other viral agents as the cause of diseases classified elsewhere: Secondary | ICD-10-CM | POA: Diagnosis not present

## 2021-10-16 DIAGNOSIS — Z6834 Body mass index (BMI) 34.0-34.9, adult: Secondary | ICD-10-CM | POA: Diagnosis not present

## 2021-10-31 DIAGNOSIS — Z6836 Body mass index (BMI) 36.0-36.9, adult: Secondary | ICD-10-CM | POA: Diagnosis not present

## 2021-10-31 DIAGNOSIS — J9801 Acute bronchospasm: Secondary | ICD-10-CM | POA: Diagnosis not present

## 2021-10-31 DIAGNOSIS — J40 Bronchitis, not specified as acute or chronic: Secondary | ICD-10-CM | POA: Diagnosis not present

## 2022-01-15 DIAGNOSIS — Z23 Encounter for immunization: Secondary | ICD-10-CM | POA: Diagnosis not present

## 2022-02-09 DIAGNOSIS — J029 Acute pharyngitis, unspecified: Secondary | ICD-10-CM | POA: Diagnosis not present

## 2022-02-09 DIAGNOSIS — I10 Essential (primary) hypertension: Secondary | ICD-10-CM | POA: Diagnosis not present

## 2022-06-13 ENCOUNTER — Encounter: Payer: Self-pay | Admitting: Emergency Medicine

## 2022-06-13 ENCOUNTER — Other Ambulatory Visit: Payer: Self-pay

## 2022-06-13 ENCOUNTER — Ambulatory Visit
Admission: EM | Admit: 2022-06-13 | Discharge: 2022-06-13 | Disposition: A | Discharge status: Home / Self Care | Payer: BC Managed Care – PPO | Attending: Family Medicine | Admitting: Family Medicine

## 2022-06-13 DIAGNOSIS — J111 Influenza due to unidentified influenza virus with other respiratory manifestations: Secondary | ICD-10-CM | POA: Diagnosis not present

## 2022-06-13 LAB — POCT INFLUENZA A/B
Influenza A, POC: NEGATIVE
Influenza B, POC: NEGATIVE

## 2022-06-13 MED ORDER — OSELTAMIVIR PHOSPHATE 75 MG PO CAPS: 75.0000 mg | ORAL_CAPSULE | Freq: Two times a day (BID) | ORAL | 0 refills | Status: AC

## 2022-06-13 NOTE — ED Triage Notes (Signed)
Pt reports cough, chills, fatigue, body aches since yesterday.
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
## 2022-06-30 ENCOUNTER — Other Ambulatory Visit (HOSPITAL_BASED_OUTPATIENT_CLINIC_OR_DEPARTMENT_OTHER): Payer: Self-pay

## 2022-06-30 DIAGNOSIS — G4730 Sleep apnea, unspecified: Secondary | ICD-10-CM

## 2022-06-30 DIAGNOSIS — E669 Obesity, unspecified: Secondary | ICD-10-CM
